# Patient Record
Sex: Male | Born: 1975 | Race: Black or African American | Hispanic: No | Marital: Married | Smoking: Light tobacco smoker
Health system: Southern US, Community
[De-identification: ages and names within clinical notes are randomized; demographics above are authoritative.]

## PROBLEM LIST (undated history)

## (undated) DIAGNOSIS — M545 Low back pain, unspecified: Secondary | ICD-10-CM

## (undated) DIAGNOSIS — I10 Essential (primary) hypertension: Secondary | ICD-10-CM

## (undated) DIAGNOSIS — K219 Gastro-esophageal reflux disease without esophagitis: Secondary | ICD-10-CM

## (undated) HISTORY — PX: HERNIA REPAIR: SHX51

## (undated) HISTORY — DX: Essential (primary) hypertension: I10

## (undated) HISTORY — DX: Low back pain: M54.5

## (undated) HISTORY — DX: Gastro-esophageal reflux disease without esophagitis: K21.9

## (undated) HISTORY — DX: Low back pain, unspecified: M54.50

---

## 2014-12-07 LAB — HM HIV SCREENING LAB: HM HIV SCREENING: NEGATIVE

## 2018-02-03 ENCOUNTER — Encounter: Payer: Self-pay | Admitting: Internal Medicine

## 2018-02-03 ENCOUNTER — Ambulatory Visit: Payer: BC Managed Care – PPO | Admitting: Internal Medicine

## 2018-02-03 VITALS — BP 106/58 | HR 65 | Temp 98.4°F | Ht 67.0 in | Wt 141.8 lb

## 2018-02-03 DIAGNOSIS — L0291 Cutaneous abscess, unspecified: Secondary | ICD-10-CM | POA: Diagnosis not present

## 2018-02-03 DIAGNOSIS — R1013 Epigastric pain: Secondary | ICD-10-CM | POA: Diagnosis not present

## 2018-02-03 DIAGNOSIS — K219 Gastro-esophageal reflux disease without esophagitis: Secondary | ICD-10-CM | POA: Diagnosis not present

## 2018-02-03 DIAGNOSIS — M79671 Pain in right foot: Secondary | ICD-10-CM | POA: Insufficient documentation

## 2018-02-03 DIAGNOSIS — R1031 Right lower quadrant pain: Secondary | ICD-10-CM | POA: Diagnosis not present

## 2018-02-03 DIAGNOSIS — I10 Essential (primary) hypertension: Secondary | ICD-10-CM

## 2018-02-03 LAB — CBC WITH DIFFERENTIAL/PLATELET
BASOS ABS: 0.1 10*3/uL (ref 0.0–0.1)
Basophils Relative: 1.8 % (ref 0.0–3.0)
EOS ABS: 0.3 10*3/uL (ref 0.0–0.7)
Eosinophils Relative: 7.4 % — ABNORMAL HIGH (ref 0.0–5.0)
HCT: 39.3 % (ref 39.0–52.0)
Hemoglobin: 13.3 g/dL (ref 13.0–17.0)
LYMPHS ABS: 2.1 10*3/uL (ref 0.7–4.0)
Lymphocytes Relative: 46.2 % — ABNORMAL HIGH (ref 12.0–46.0)
MCHC: 33.7 g/dL (ref 30.0–36.0)
MCV: 91.4 fl (ref 78.0–100.0)
Monocytes Absolute: 0.5 10*3/uL (ref 0.1–1.0)
Monocytes Relative: 11.4 % (ref 3.0–12.0)
NEUTROS ABS: 1.5 10*3/uL (ref 1.4–7.7)
NEUTROS PCT: 33.2 % — AB (ref 43.0–77.0)
PLATELETS: 282 10*3/uL (ref 150.0–400.0)
RBC: 4.3 Mil/uL (ref 4.22–5.81)
RDW: 13.8 % (ref 11.5–15.5)
WBC: 4.5 10*3/uL (ref 4.0–10.5)

## 2018-02-03 LAB — COMPREHENSIVE METABOLIC PANEL
ALT: 18 U/L (ref 0–53)
AST: 21 U/L (ref 0–37)
Albumin: 4.7 g/dL (ref 3.5–5.2)
Alkaline Phosphatase: 62 U/L (ref 39–117)
BUN: 16 mg/dL (ref 6–23)
CO2: 32 meq/L (ref 19–32)
CREATININE: 1.12 mg/dL (ref 0.40–1.50)
Calcium: 9.8 mg/dL (ref 8.4–10.5)
Chloride: 97 mEq/L (ref 96–112)
GFR: 92.8 mL/min (ref >60.00)
GLUCOSE: 77 mg/dL (ref 70–99)
Potassium: 4.1 mEq/L (ref 3.5–5.1)
SODIUM: 135 meq/L (ref 135–145)
Total Bilirubin: 0.5 mg/dL (ref 0.2–1.2)
Total Protein: 7.7 g/dL (ref 6.0–8.3)

## 2018-02-03 MED ORDER — LISINOPRIL 10 MG PO TABS: 10.0000 mg | ORAL_TABLET | Freq: Every day | ORAL | 0 refills | Status: DC

## 2018-02-03 MED ORDER — PANTOPRAZOLE SODIUM 20 MG PO TBEC: 20.0000 mg | DELAYED_RELEASE_TABLET | Freq: Every day | ORAL | 1 refills | Status: DC

## 2018-02-03 NOTE — Progress Notes (Addendum)
Chief Complaint  Patient presents with  . New Patient (Initial Visit)   New patient  1. HTN controlled pt wants to go off on lisinopril 20 mg qd  2. GERD rantidine 150 mg bid not helping 3. C/o RUQ pain since 11/2017 [place] reviewed care everwhere negative. Also c/o RLQ ab pain no bloody diarrhea or blood in stool pain is intermittent  4. C/o foot pain in right arch of foot due to walking 17K steps since 10/2017 getting new shoes today  5. H/o bump to chin x 2 months increasing in size.  6. C/o poor dentition needs dentist   Review of Systems  Constitutional: Negative for weight loss.  HENT: Negative for hearing loss.   Eyes: Negative for blurred vision.  Respiratory: Negative for shortness of breath.   Cardiovascular: Negative for chest pain.  Gastrointestinal: Positive for abdominal pain. Negative for blood in stool, constipation and diarrhea.  Skin: Negative for rash.  Neurological: Negative for headaches.  Psychiatric/Behavioral: Negative for depression.   Past Medical History:  Diagnosis Date  . GERD (gastroesophageal reflux disease)   . Hypertension    Past Surgical History:  Procedure Laterality Date  . HERNIA REPAIR     left inguinal    Family History  Problem Relation Age of Onset  . Hypertension Mother   . Hypertension Maternal Grandmother   . Dementia Maternal Grandfather   . Heart disease Maternal Grandfather        cad with stent  . Cancer Paternal Grandfather        colon   . Cancer Other        colon    Social History   Socioeconomic History  . Marital status: Married    Spouse name: Not on file  . Number of children: Not on file  . Years of education: Not on file  . Highest education level: Not on file  Occupational History  . Not on file  Social Needs  . Financial resource strain: Not on file  . Food insecurity:    Worry: Not on file    Inability: Not on file  . Transportation needs:    Medical: Not on file    Non-medical: Not on file  Tobacco  Use  . Smoking status: Light Tobacco Smoker    Types: Cigarettes  . Smokeless tobacco: Never Used  . Tobacco comment: 1 cigarette qd since age 30 y.o smoking on and off   Substance and Sexual Activity  . Alcohol use: Yes  . Drug use: Not Currently  . Sexual activity: Yes  Lifestyle  . Physical activity:    Days per week: Not on file    Minutes per session: Not on file  . Stress: Not on file  Relationships  . Social connections:    Talks on phone: Not on file    Gets together: Not on file    Attends religious service: Not on file    Active member of club or organization: Not on file    Attends meetings of clubs or organizations: Not on file    Relationship status: Not on file  . Intimate partner violence:    Fear of current or ex partner: Not on file    Emotionally abused: Not on file    Physically abused: Not on file    Forced sexual activity: Not on file  Other Topics Concern  . 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 lisinopril (PRINIVIL,ZESTRIL) 10 MG tablet Take 1 tablet (10 mg total) by mouth daily.  . ranitidine (ZANTAC) 150 MG capsule 150 mg daily.   . [DISCONTINUED] lisinopril (PRINIVIL,ZESTRIL) 20 MG tablet Take 20 mg by mouth daily.    No Known Allergies No results found for this or any previous visit (from the past 2160 hour(s)). Objective  Body mass index is 22.21 kg/m. Wt Readings from Last 3 Encounters:  02/03/18 141 lb 12.8 oz (64.3 kg)   Temp Readings from Last 3 Encounters:  02/03/18 98.4 F (36.9 C) (Oral)   BP Readings from Last 3 Encounters:  02/03/18 (!) 106/58   Pulse Readings from Last 3 Encounters:  02/03/18 65    Physical Exam  Constitutional: He is oriented to person, place, and time. Vital signs are normal. He  appears well-developed and well-nourished. He is cooperative.  HENT:  Head: Normocephalic and atraumatic.  Mouth/Throat: Oropharynx is clear and moist and mucous membranes are normal. Abnormal dentition.  Poor dentition   Eyes: Pupils are equal, round, and reactive to light. Conjunctivae are normal.  Cardiovascular: Normal rate, regular rhythm and normal heart sounds.  Pulmonary/Chest: Effort normal and breath sounds normal.  Abdominal: Soft. Bowel sounds are normal. There is no tenderness.  Musculoskeletal:  No ttp right midfoot today  Neurological: He is alert and oriented to person, place, and time. Gait normal.  Skin: Skin is warm, dry and intact.     Psychiatric: He has a normal mood and affect. His speech is normal and behavior is normal. Judgment and thought content normal. Cognition and memory are normal.  Nursing note and vitals reviewed.   Assessment   1. HTN  2. GERD, dyspepsia  3. RUQ, RLQ ab pain  4. Right midfoot pain  5. Cyst vs abscess to chin  6. Poor dentition  7.HM  Plan  1. Reduce lis to 10 mg qd  2. D/c ranitidine change to protonix 20 mg qd  3. Ct ab/pelvis if neg refer to GI [facility] colon cancer  4. Disc better shoes and insoles 5. Refer to dermatology Dr. Kellie Moor removal  6. Disc [facility] in [place] 7. Had flu  Tdap had need to get records all vaccines and hep B titer  Records [facility] in [place] and [place]. Health records reviewed  Ppd neg 12/10/17 Varicella + 10/27/17, chicken pox +titer 11/03/17  Flu had 07/16/17 Hep B 1 had 09/05/02, 06/13/09, 12/25/10 MMR had 10/27/17, 11/27/17 Tdap had 06/16/08  HIV neg 12/07/14   Provider: Dr. Olivia Mackie McLean-Scocuzza-[facility]

## 2018-02-03 NOTE — Progress Notes (Signed)
Pre visit review using our clinic review tool, if applicable. No additional management support is needed unless otherwise documented below in the visit note. 

## 2018-02-03 NOTE — Patient Instructions (Addendum)
F/u in 2 months  We referred to Dr. Roseanne Kaufman dermatology on [facility] for removal of chin lesion  We will order a CT abdomen/pelvis at [facility] hospital  Please cut lisinopril in 1/2 to 10 mg daily new dose is 10 mg daily Try [facility] [address] [phone] or [facility] Dr. Edrick Kins in [place] [phone]  Try Dr. Jari Sportsman insoles for your new sneakers for work  Come back in 2 months    Gastroesophageal Reflux Disease, Adult Normally, food travels down the esophagus and stays in the stomach to be digested. If a person has gastroesophageal reflux disease (GERD), food and stomach acid move back up into the esophagus. When this happens, the esophagus becomes sore and swollen (inflamed). Over time, GERD can make small holes (ulcers) in the lining of the esophagus. Follow these instructions at home: Diet  Follow a diet as told by your doctor. You may need to avoid foods and drinks such as: ? Coffee and tea (with or without caffeine). ? Drinks that contain alcohol. ? Energy drinks and sports drinks. ? Carbonated drinks or sodas. ? Chocolate and cocoa. ? Peppermint and mint flavorings. ? Garlic and onions. ? Horseradish. ? Spicy and acidic foods, such as peppers, chili powder, curry powder, vinegar, hot sauces, and BBQ sauce. ? Citrus fruit juices and citrus fruits, such as oranges, lemons, and limes. ? Tomato-based foods, such as red sauce, chili, salsa, and pizza with red sauce. ? Fried and fatty foods, such as donuts, french fries, potato chips, and high-fat dressings. ? High-fat meats, such as hot dogs, rib eye steak, sausage, ham, and bacon. ? High-fat dairy items, such as whole milk, butter, and cream cheese.  Eat small meals often. Avoid eating large meals.  Avoid drinking large amounts of liquid with your meals.  Avoid eating meals during the 2-3 hours before bedtime.  Avoid lying down right after you eat.  Do not exercise right after you eat. General  instructions  Pay attention to any changes in your symptoms.  Take over-the-counter and prescription medicines only as told by your doctor. Do not take aspirin, ibuprofen, or other NSAIDs unless your doctor says it is okay.  Do not use any tobacco products, including cigarettes, chewing tobacco, and e-cigarettes. If you need help quitting, ask your doctor.  Wear loose clothes. Do not wear anything tight around your waist.  Raise (elevate) the head of your bed about 6 inches (15 cm).  Try to lower your stress. If you need help doing this, ask your doctor.  If you are overweight, lose an amount of weight that is healthy for you. Ask your doctor about a safe weight loss goal.  Keep all follow-up visits as told by your doctor. This is important. Contact a doctor if:  You have new symptoms.  You lose weight and you do not know why it is happening.  You have trouble swallowing, or it hurts to swallow.  You have wheezing or a cough that keeps happening.  Your symptoms do not get better with treatment.  You have a hoarse voice. Get help right away if:  You have pain in your arms, neck, jaw, teeth, or back.  You feel sweaty, dizzy, or light-headed.  You have chest pain or shortness of breath.  You throw up (vomit) and your throw up looks like blood or coffee grounds.  You pass out (faint).  Your poop (stool) is bloody or black.  You cannot swallow, drink, or eat. This  information is not intended to replace advice given to you by your health care provider. Make sure you discuss any questions you have with your health care provider. Document Released: 03/10/2008 Document Revised: 02/28/2016 Document Reviewed: 01/17/2015 Elsevier Interactive Patient Education  2018 [employer].  Food Choices for Gastroesophageal Reflux Disease, Adult When you have gastroesophageal reflux disease (GERD), the foods you eat and your eating habits are very important. Choosing the right foods can  help ease your discomfort. What guidelines do I need to follow?  Choose fruits, vegetables, whole grains, and low-fat dairy products.  Choose low-fat meat, fish, and poultry.  Limit fats such as oils, salad dressings, butter, nuts, and avocado.  Keep a food diary. This helps you identify foods that cause symptoms.  Avoid foods that cause symptoms. These may be different for everyone.  Eat small meals often instead of 3 large meals a day.  Eat your meals slowly, in a place where you are relaxed.  Limit fried foods.  Cook foods using methods other than frying.  Avoid drinking alcohol.  Avoid drinking large amounts of liquids with your meals.  Avoid bending over or lying down until 2-3 hours after eating. What foods are not recommended? These are some foods and drinks that may make your symptoms worse: Vegetables Tomatoes. Tomato juice. Tomato and spaghetti sauce. Chili peppers. Onion and garlic. Horseradish. Fruits Oranges, grapefruit, and lemon (fruit and juice). Meats High-fat meats, fish, and poultry. This includes hot dogs, ribs, ham, sausage, salami, and bacon. Dairy Whole milk and chocolate milk. Sour cream. Cream. Butter. Ice cream. Cream cheese. Drinks Coffee and tea. Bubbly (carbonated) drinks or energy drinks. Condiments Hot sauce. Barbecue sauce. Sweets/Desserts Chocolate and cocoa. Donuts. Peppermint and spearmint. Fats and Oils High-fat foods. This includes [place] fries and potato chips. Other Vinegar. Strong spices. This includes black pepper, white pepper, red pepper, cayenne, curry powder, cloves, ginger, and chili powder. The items listed above may not be a complete list of foods and drinks to avoid. Contact your dietitian for more information. This information is not intended to replace advice given to you by your health care provider. Make sure you discuss any questions you have with your health care provider. Document Released: 03/23/2012 Document  Revised: 02/28/2016 Document Reviewed: 07/27/2013 Elsevier Interactive Patient Education  2017 [employer] Inc[employer]   DASH Eating Plan DASH stands for "Dietary Approaches to Stop Hypertension." The DASH eating plan is a healthy eating plan that has been shown to reduce high blood pressure (hypertension). It may also reduce your risk for type 2 diabetes, heart disease, and stroke. The DASH eating plan may also help with weight loss. What are tips for following this plan? General guidelines  Avoid eating more than 2,300 mg (milligrams) of salt (sodium) a day. If you have hypertension, you may need to reduce your sodium intake to 1,500 mg a day.  Limit alcohol intake to no more than 1 drink a day for nonpregnant women and 2 drinks a day for men. One drink equals 12 oz of beer, 5 oz of wine, or 1 oz of hard liquor.  Work with your health care provider to maintain a healthy body weight or to lose weight. Ask what an ideal weight is for you.  Get at least 30 minutes of exercise that causes your heart to beat faster (aerobic exercise) most days of the week. Activities may include walking, swimming, or biking.  Work with your health care provider or diet and nutrition specialist (dietitian) to adjust  your eating plan to your individual calorie needs. Reading food labels  Check food labels for the amount of sodium per serving. Choose foods with less than 5 percent of the Daily Value of sodium. Generally, foods with less than 300 mg of sodium per serving fit into this eating plan.  To find whole grains, look for the word "whole" as the first word in the ingredient list. Shopping  Buy products labeled as "low-sodium" or "no salt added."  Buy fresh foods. Avoid canned foods and premade or frozen meals. Cooking  Avoid adding salt when cooking. Use salt-free seasonings or herbs instead of table salt or sea salt. Check with your health care provider or pharmacist before using salt substitutes.  Do not  fry foods. Cook foods using healthy methods such as baking, boiling, grilling, and broiling instead.  Cook with heart-healthy oils, such as olive, canola, soybean, or sunflower oil. Meal planning   Eat a balanced diet that includes: ? 5 or more servings of fruits and vegetables each day. At each meal, try to fill half of your plate with fruits and vegetables. ? Up to 6-8 servings of whole grains each day. ? Less than 6 oz of lean meat, poultry, or fish each day. A 3-oz serving of meat is about the same size as a deck of cards. One egg equals 1 oz. ? 2 servings of low-fat dairy each day. ? A serving of nuts, seeds, or beans 5 times each week. ? Heart-healthy fats. Healthy fats called Omega-3 fatty acids are found in foods such as flaxseeds and coldwater fish, like sardines, salmon, and mackerel.  Limit how much you eat of the following: ? Canned or prepackaged foods. ? Food that is high in trans fat, such as fried foods. ? Food that is high in saturated fat, such as fatty meat. ? Sweets, desserts, sugary drinks, and other foods with added sugar. ? Full-fat dairy products.  Do not salt foods before eating.  Try to eat at least 2 vegetarian meals each week.  Eat more home-cooked food and less restaurant, buffet, and fast food.  When eating at a restaurant, ask that your food be prepared with less salt or no salt, if possible. What foods are recommended? The items listed may not be a complete list. Talk with your dietitian about what dietary choices are best for you. Grains Whole-grain or whole-wheat bread. Whole-grain or whole-wheat pasta. Brown rice. Orpah Cobb. Bulgur. Whole-grain and low-sodium cereals. Pita bread. Low-fat, low-sodium crackers. Whole-wheat flour tortillas. Vegetables Fresh or frozen vegetables (raw, steamed, roasted, or grilled). Low-sodium or reduced-sodium tomato and vegetable juice. Low-sodium or reduced-sodium tomato sauce and tomato paste. Low-sodium or  reduced-sodium canned vegetables. Fruits All fresh, dried, or frozen fruit. Canned fruit in natural juice (without added sugar). Meat and other protein foods Skinless chicken or [place]. Ground chicken or [place]. Pork with fat trimmed off. Fish and seafood. Egg whites. Dried beans, peas, or lentils. Unsalted nuts, nut butters, and seeds. Unsalted canned beans. Lean cuts of beef with fat trimmed off. Low-sodium, lean deli meat. Dairy Low-fat (1%) or fat-free (skim) milk. Fat-free, low-fat, or reduced-fat cheeses. Nonfat, low-sodium ricotta or cottage cheese. Low-fat or nonfat yogurt. Low-fat, low-sodium cheese. Fats and oils Soft margarine without trans fats. Vegetable oil. Low-fat, reduced-fat, or light mayonnaise and salad dressings (reduced-sodium). Canola, safflower, olive, soybean, and sunflower oils. Avocado. Seasoning and other foods Herbs. Spices. Seasoning mixes without salt. Unsalted popcorn and pretzels. Fat-free sweets. What foods are not recommended? The  items listed may not be a complete list. Talk with your dietitian about what dietary choices are best for you. Grains Baked goods made with fat, such as croissants, muffins, or some breads. Dry pasta or rice meal packs. Vegetables Creamed or fried vegetables. Vegetables in a cheese sauce. Regular canned vegetables (not low-sodium or reduced-sodium). Regular canned tomato sauce and paste (not low-sodium or reduced-sodium). Regular tomato and vegetable juice (not low-sodium or reduced-sodium). Rosita Fire. Olives. Fruits Canned fruit in a light or heavy syrup. Fried fruit. Fruit in cream or butter sauce. Meat and other protein foods Fatty cuts of meat. Ribs. Fried meat. Tomasa Blase. Sausage. Bologna and other processed lunch meats. Salami. Fatback. Hotdogs. Bratwurst. Salted nuts and seeds. Canned beans with added salt. Canned or smoked fish. Whole eggs or egg yolks. Chicken or [place] with skin. Dairy Whole or 2% milk, cream, and half-and-half.  Whole or full-fat cream cheese. Whole-fat or sweetened yogurt. Full-fat cheese. Nondairy creamers. Whipped toppings. Processed cheese and cheese spreads. Fats and oils Butter. Stick margarine. Lard. Shortening. Ghee. Bacon fat. Tropical oils, such as coconut, palm kernel, or palm oil. Seasoning and other foods Salted popcorn and pretzels. Onion salt, garlic salt, seasoned salt, table salt, and sea salt. Worcestershire sauce. Tartar sauce. Barbecue sauce. Teriyaki sauce. Soy sauce, including reduced-sodium. Steak sauce. Canned and packaged gravies. Fish sauce. Oyster sauce. Cocktail sauce. Horseradish that you find on the shelf. Ketchup. Mustard. Meat flavorings and tenderizers. Bouillon cubes. Hot sauce and Tabasco sauce. Premade or packaged marinades. Premade or packaged taco seasonings. Relishes. Regular salad dressings. Where to find more information:  National Heart, Lung, and Blood Institute: PopSteam.is  American Heart Association: www.heart.org Summary  The DASH eating plan is a healthy eating plan that has been shown to reduce high blood pressure (hypertension). It may also reduce your risk for type 2 diabetes, heart disease, and stroke.  With the DASH eating plan, you should limit salt (sodium) intake to 2,300 mg a day. If you have hypertension, you may need to reduce your sodium intake to 1,500 mg a day.  When on the DASH eating plan, aim to eat more fresh fruits and vegetables, whole grains, lean proteins, low-fat dairy, and heart-healthy fats.  Work with your health care provider or diet and nutrition specialist (dietitian) to adjust your eating plan to your individual calorie needs. This information is not intended to replace advice given to you by your health care provider. Make sure you discuss any questions you have with your health care provider. Document Released: 09/11/2011 Document Revised: 09/15/2016 Document Reviewed: 09/15/2016 Elsevier Interactive Patient Education   2018 [employer].  Hypertension Hypertension, commonly called high blood pressure, is when the force of blood pumping through the arteries is too strong. The arteries are the blood vessels that carry blood from the heart throughout the body. Hypertension forces the heart to work harder to pump blood and may cause arteries to become narrow or stiff. Having untreated or uncontrolled hypertension can cause heart attacks, strokes, kidney disease, and other problems. A blood pressure reading consists of a higher number over a lower number. Ideally, your blood pressure should be below 120/80. The first ("top") number is called the systolic pressure. It is a measure of the pressure in your arteries as your heart beats. The second ("bottom") number is called the diastolic pressure. It is a measure of the pressure in your arteries as the heart relaxes. What are the causes? The cause of this condition is not known. What increases the  risk? Some risk factors for high blood pressure are under your control. Others are not. Factors you can change  Smoking.  Having type 2 diabetes mellitus, high cholesterol, or both.  Not getting enough exercise or physical activity.  Being overweight.  Having too much fat, sugar, calories, or salt (sodium) in your diet.  Drinking too much alcohol. Factors that are difficult or impossible to change  Having chronic kidney disease.  Having a family history of high blood pressure.  Age. Risk increases with age.  Race. You may be at higher risk if you are African-American.  Gender. Men are at higher risk than women before age 75. After age 62, women are at higher risk than men.  Having obstructive sleep apnea.  Stress. What are the signs or symptoms? Extremely high blood pressure (hypertensive crisis) may cause:  Headache.  Anxiety.  Shortness of breath.  Nosebleed.  Nausea and vomiting.  Severe chest pain.  Jerky movements you cannot control  (seizures).  How is this diagnosed? This condition is diagnosed by measuring your blood pressure while you are seated, with your arm resting on a surface. The cuff of the blood pressure monitor will be placed directly against the skin of your upper arm at the level of your heart. It should be measured at least twice using the same arm. Certain conditions can cause a difference in blood pressure between your right and left arms. Certain factors can cause blood pressure readings to be lower or higher than normal (elevated) for a short period of time:  When your blood pressure is higher when you are in a health care provider's office than when you are at home, this is called white coat hypertension. Most people with this condition do not need medicines.  When your blood pressure is higher at home than when you are in a health care provider's office, this is called masked hypertension. Most people with this condition may need medicines to control blood pressure.  If you have a high blood pressure reading during one visit or you have normal blood pressure with other risk factors:  You may be asked to return on a different day to have your blood pressure checked again.  You may be asked to monitor your blood pressure at home for 1 week or longer.  If you are diagnosed with hypertension, you may have other blood or imaging tests to help your health care provider understand your overall risk for other conditions. How is this treated? This condition is treated by making healthy lifestyle changes, such as eating healthy foods, exercising more, and reducing your alcohol intake. Your health care provider may prescribe medicine if lifestyle changes are not enough to get your blood pressure under control, and if:  Your systolic blood pressure is above 130.  Your diastolic blood pressure is above 80.  Your personal target blood pressure may vary depending on your medical conditions, your age, and other  factors. Follow these instructions at home: Eating and drinking  Eat a diet that is high in fiber and potassium, and low in sodium, added sugar, and fat. An example eating plan is called the DASH (Dietary Approaches to Stop Hypertension) diet. To eat this way: ? Eat plenty of fresh fruits and vegetables. Try to fill half of your plate at each meal with fruits and vegetables. ? Eat whole grains, such as whole wheat pasta, brown rice, or whole grain bread. Fill about one quarter of your plate with whole grains. ? Eat or  drink low-fat dairy products, such as skim milk or low-fat yogurt. ? Avoid fatty cuts of meat, processed or cured meats, and poultry with skin. Fill about one quarter of your plate with lean proteins, such as fish, chicken without skin, beans, eggs, and tofu. ? Avoid premade and processed foods. These tend to be higher in sodium, added sugar, and fat.  Reduce your daily sodium intake. Most people with hypertension should eat less than 1,500 mg of sodium a day.  Limit alcohol intake to no more than 1 drink a day for nonpregnant women and 2 drinks a day for men. One drink equals 12 oz of beer, 5 oz of wine, or 1 oz of hard liquor. Lifestyle  Work with your health care provider to maintain a healthy body weight or to lose weight. Ask what an ideal weight is for you.  Get at least 30 minutes of exercise that causes your heart to beat faster (aerobic exercise) most days of the week. Activities may include walking, swimming, or biking.  Include exercise to strengthen your muscles (resistance exercise), such as pilates or lifting weights, as part of your weekly exercise routine. Try to do these types of exercises for 30 minutes at least 3 days a week.  Do not use any products that contain nicotine or tobacco, such as cigarettes and e-cigarettes. If you need help quitting, ask your health care provider.  Monitor your blood pressure at home as told by your health care provider.  Keep  all follow-up visits as told by your health care provider. This is important. Medicines  Take over-the-counter and prescription medicines only as told by your health care provider. Follow directions carefully. Blood pressure medicines must be taken as prescribed.  Do not skip doses of blood pressure medicine. Doing this puts you at risk for problems and can make the medicine less effective.  Ask your health care provider about side effects or reactions to medicines that you should watch for. Contact a health care provider if:  You think you are having a reaction to a medicine you are taking.  You have headaches that keep coming back (recurring).  You feel dizzy.  You have swelling in your ankles.  You have trouble with your vision. Get help right away if:  You develop a severe headache or confusion.  You have unusual weakness or numbness.  You feel faint.  You have severe pain in your chest or abdomen.  You vomit repeatedly.  You have trouble breathing. Summary  Hypertension is when the force of blood pumping through your arteries is too strong. If this condition is not controlled, it may put you at risk for serious complications.  Your personal target blood pressure may vary depending on your medical conditions, your age, and other factors. For most people, a normal blood pressure is less than 120/80.  Hypertension is treated with lifestyle changes, medicines, or a combination of both. Lifestyle changes include weight loss, eating a healthy, low-sodium diet, exercising more, and limiting alcohol. This information is not intended to replace advice given to you by your health care provider. Make sure you discuss any questions you have with your health care provider. Document Released: 09/22/2005 Document Revised: 08/20/2016 Document Reviewed: 08/20/2016 Elsevier Interactive Patient Education  [employer].

## 2018-02-04 ENCOUNTER — Telehealth: Payer: Self-pay | Admitting: Internal Medicine

## 2018-02-04 NOTE — Telephone Encounter (Signed)
Copied from CRM [id number]. Topic: General - Other >> Feb 04, 2018 10:38 AM Gerrianne Scale wrote: Reason for CRM: pt calling about lab results

## 2018-02-04 NOTE — Telephone Encounter (Signed)
    Patient calling for lab results 

## 2018-02-04 NOTE — Telephone Encounter (Signed)
Left message to call back- labs in basket.

## 2018-02-06 LAB — URINALYSIS, ROUTINE W REFLEX MICROSCOPIC
BILIRUBIN UA: NEGATIVE
GLUCOSE, UA: NEGATIVE
KETONES UA: NEGATIVE
Leukocytes, UA: NEGATIVE
NITRITE UA: NEGATIVE
Protein, UA: NEGATIVE
RBC, UA: NEGATIVE
SPEC GRAV UA: 1.017 (ref 1.005–1.030)
UUROB: 0.2 mg/dL (ref 0.2–1.0)
pH, UA: 6.5 (ref 5.0–7.5)

## 2018-02-06 LAB — H PYLORI, IGM, IGG, IGA AB
H. pylori, IgA Abs: <9 units (ref 0.0–8.9)
H. pylori, IgG AbS: <0.8 Index Value (ref 0.00–0.79)

## 2018-02-08 NOTE — Telephone Encounter (Signed)
void

## 2018-02-10 ENCOUNTER — Telehealth: Payer: Self-pay

## 2018-02-10 NOTE — Telephone Encounter (Signed)
Copied from CRM [id number]. Topic: Quick Communication - See Telephone Encounter >> Feb 10, 2018  3:08 PM Bronwen Betters, CMA wrote: CRM for notification. See Telephone encounter for: 02/10/18. >> Feb 10, 2018  3:22 PM Landry Mellow wrote: Pt is returning call please call .[phone]   Patient notified that labs showed no H pylori and urine was normal patient verbalized understanding of results

## 2018-02-11 ENCOUNTER — Ambulatory Visit
Admission: RE | Admit: 2018-02-11 | Discharge: 2018-02-11 | Disposition: A | Discharge status: Home / Self Care | Payer: BC Managed Care – PPO | Source: Ambulatory Visit | Attending: Internal Medicine | Admitting: Internal Medicine

## 2018-02-11 DIAGNOSIS — R1031 Right lower quadrant pain: Secondary | ICD-10-CM | POA: Insufficient documentation

## 2018-02-11 MED ORDER — IOPAMIDOL (ISOVUE-300) INJECTION 61%
85.0000 mL | Freq: Once | INTRAVENOUS | Status: AC | PRN
  Administered 2018-02-11: 85 mL via INTRAVENOUS

## 2018-02-17 ENCOUNTER — Encounter: Payer: Self-pay | Admitting: *Deleted

## 2018-04-07 ENCOUNTER — Ambulatory Visit: Payer: BC Managed Care – PPO | Admitting: Internal Medicine

## 2018-04-07 DIAGNOSIS — Z0289 Encounter for other administrative examinations: Secondary | ICD-10-CM

## 2018-05-07 ENCOUNTER — Other Ambulatory Visit: Payer: Self-pay | Admitting: Internal Medicine

## 2018-05-07 DIAGNOSIS — I10 Essential (primary) hypertension: Secondary | ICD-10-CM

## 2018-05-07 MED ORDER — LISINOPRIL 10 MG PO TABS: 10.0000 mg | ORAL_TABLET | Freq: Every day | ORAL | 1 refills | Status: DC

## 2018-05-17 ENCOUNTER — Ambulatory Visit: Payer: BC Managed Care – PPO | Admitting: Family Medicine

## 2018-05-18 ENCOUNTER — Ambulatory Visit: Payer: BC Managed Care – PPO | Admitting: Family Medicine

## 2018-05-21 ENCOUNTER — Encounter: Payer: Self-pay | Admitting: Family Medicine

## 2018-05-21 ENCOUNTER — Ambulatory Visit: Payer: BC Managed Care – PPO | Admitting: Family Medicine

## 2018-05-21 VITALS — BP 130/70 | HR 66 | Temp 99.1°F | Ht 67.0 in | Wt 135.8 lb

## 2018-05-21 DIAGNOSIS — K219 Gastro-esophageal reflux disease without esophagitis: Secondary | ICD-10-CM

## 2018-05-21 DIAGNOSIS — M62838 Other muscle spasm: Secondary | ICD-10-CM

## 2018-05-21 DIAGNOSIS — M545 Low back pain, unspecified: Secondary | ICD-10-CM

## 2018-05-21 MED ORDER — RANITIDINE HCL 150 MG PO CAPS: 150.0000 mg | ORAL_CAPSULE | Freq: Every evening | ORAL | 1 refills | Status: DC

## 2018-05-21 MED ORDER — KETOROLAC TROMETHAMINE 60 MG/2ML IM SOLN
60.0000 mg | Freq: Once | INTRAMUSCULAR | Status: AC
  Administered 2018-05-21: 60 mg via INTRAMUSCULAR

## 2018-05-21 MED ORDER — METHYLPREDNISOLONE 4 MG PO TBPK: ORAL_TABLET | ORAL | 0 refills | Status: DC

## 2018-05-21 MED ORDER — CYCLOBENZAPRINE HCL 5 MG PO TABS: 5.0000 mg | ORAL_TABLET | Freq: Three times a day (TID) | ORAL | 0 refills | Status: DC | PRN

## 2018-05-21 NOTE — Progress Notes (Signed)
Pre visit review using our clinic review tool, if applicable. No additional management support is needed unless otherwise documented below in the visit note. 

## 2018-05-21 NOTE — Progress Notes (Signed)
Subjective:    Patient ID: Tyler Norton, male    DOB: June 02, 1976, 42 y.o.   MRN: [id number]  HPI Presents to clinic due to back pain -states he was leaning over in playpen to pick up a young daughter felt a pulling pain and right-sided low back. States he had to lay on floor for 30 minutes to let back pain calm down. Denies any numbness/tingling down legs. No loss of bowel or bladder control.  Also he has been taking Protonix for his GERD symptoms.  Notes he is still having breakthrough acid reflux.  He had been taking Zantac in the past, but was switched to Protonix for better acid control.  Patient Active Problem List   Diagnosis Date Noted  . Essential hypertension 02/03/2018  . Right lower quadrant abdominal pain 02/03/2018  . Gastroesophageal reflux disease 02/03/2018  . Abscess 02/03/2018  . Pain of right midfoot 02/03/2018   Social History   Tobacco Use  . Smoking status: Light Tobacco Smoker    Types: Cigarettes  . Smokeless tobacco: Never Used  . Tobacco comment: 1 cigarette qd since age 42 y.o smoking on and off   Substance Use Topics  . Alcohol use: Yes   Review of Systems  Constitutional: Negative for chills, fatigue and fever.  HENT: Negative for congestion, ear pain, sinus pain and sore throat.   Eyes: Negative.   Respiratory: Negative for cough, shortness of breath and wheezing.   Cardiovascular: Negative for chest pain, palpitations and leg swelling.  Gastrointestinal: Negative for abdominal pain, diarrhea, nausea and vomiting. +GERD Genitourinary: Negative for dysuria, frequency and urgency.  Musculoskeletal: Positive for right-sided low back pain. Skin: Negative for color change, pallor and rash.  Neurological: Negative for syncope, light-headedness and headaches.  Psychiatric/Behavioral: The patient is not nervous/anxious.       Objective:   Physical Exam  Constitutional: He is oriented to person, place, and time. He appears well-developed and  well-nourished. No distress.  Head: Normocephalic and atraumatic.  Neck: Normal range of motion. Neck supple. No tracheal deviation present.  Cardiovascular: Normal rate, regular rhythm and normal heart sounds.  Pulmonary/Chest: Effort normal and breath sounds normal. No respiratory distress. ABD: Soft, +BS, no tenderness Musculoskeletal: Positive right sided low back muscle tenderness.  Patient is able to bend forward and backward, side to side.  Pulling pain in the right low back right leg elevated. Neurological: He is alert and oriented to person, place, and time. Gait normal  Skin: Skin is warm and dry. He is not diaphoretic. No pallor.  Psychiatric: He has a normal mood and affect. His behavior is normal. Thought content normal.  Nursing note and vitals reviewed.   Vitals:   05/21/18 0959  BP: 130/70  Pulse: 66  Temp: 99.1 F (37.3 C)  SpO2: 99%      Assessment & Plan:    Back pain -patient given 60 mg Toradol IM x1 in clinic.  He will take short course steroid taper.  Muscle spasm- patient advised to continue to use topical muscle rub at home.  He can also use Lexapro 5 mg as needed for muscle spasm, patient aware this drug may cause drowsiness and to not drive after taking this medication.  GERD-he will continue to take Protonix in the morning and we will trial adding Zantac in the evening to see if he gets better acid reflux control.  Patient advised he may need to have endoscopy in the future if symptoms continue to persist this level.  Keep 06/04/18 appt as scheduled

## 2018-05-21 NOTE — Patient Instructions (Signed)
Great to meet you!  Take protonix in AM and Zantac in PM for acid reflux control

## 2018-06-04 ENCOUNTER — Ambulatory Visit: Payer: BC Managed Care – PPO | Admitting: Internal Medicine

## 2018-06-04 ENCOUNTER — Encounter: Payer: Self-pay | Admitting: Internal Medicine

## 2018-06-04 ENCOUNTER — Ambulatory Visit (INDEPENDENT_AMBULATORY_CARE_PROVIDER_SITE_OTHER): Payer: BC Managed Care – PPO

## 2018-06-04 VITALS — BP 136/80 | HR 64 | Temp 98.3°F | Ht 67.0 in | Wt 139.8 lb

## 2018-06-04 DIAGNOSIS — M545 Low back pain, unspecified: Secondary | ICD-10-CM

## 2018-06-04 DIAGNOSIS — M5441 Lumbago with sciatica, right side: Secondary | ICD-10-CM

## 2018-06-04 DIAGNOSIS — M25531 Pain in right wrist: Secondary | ICD-10-CM

## 2018-06-04 DIAGNOSIS — M62838 Other muscle spasm: Secondary | ICD-10-CM

## 2018-06-04 DIAGNOSIS — K219 Gastro-esophageal reflux disease without esophagitis: Secondary | ICD-10-CM

## 2018-06-04 DIAGNOSIS — I10 Essential (primary) hypertension: Secondary | ICD-10-CM

## 2018-06-04 MED ORDER — CYCLOBENZAPRINE HCL 5 MG PO TABS: 5.0000 mg | ORAL_TABLET | Freq: Every evening | ORAL | 0 refills | Status: DC | PRN

## 2018-06-04 NOTE — Patient Instructions (Addendum)
Tylenol 500 mg no more than 6 in 1 day  -If 650 mg then no more 4 in 1 day  -325 mg no more than 9 pills in 1 day   Try Ranitidine at night before bed   F/u in 2 months no food only water and medicines x 12 hours   Back Pain, Adult Many adults have back pain from time to time. Common causes of back pain include:  A strained muscle or ligament.  Wear and tear (degeneration) of the spinal disks.  Arthritis.  A hit to the back.  Back pain can be short-lived (acute) or last a long time (chronic). A physical exam, lab tests, and imaging studies may be done to find the cause of your pain. Follow these instructions at home: Managing pain and stiffness  Take over-the-counter and prescription medicines only as told by your health care provider.  If directed, apply heat to the affected area as often as told by your health care provider. Use the heat source that your health care provider recommends, such as a moist heat pack or a heating pad. ? Place a towel between your skin and the heat source. ? Leave the heat on for 20-30 minutes. ? Remove the heat if your skin turns bright red. This is especially important if you are unable to feel pain, heat, or cold. You have a greater risk of getting burned.  If directed, apply ice to the injured area: ? Put ice in a plastic bag. ? Place a towel between your skin and the bag. ? Leave the ice on for 20 minutes, 2-3 times a day for the first 2-3 days. Activity  Do not stay in bed. Resting more than 1-2 days can delay your recovery.  Take short walks on even surfaces as soon as you are able. Try to increase the length of time you walk each day.  Do not sit, drive, or stand in one place for more than 30 minutes at a time. Sitting or standing for long periods of time can put stress on your back.  Use proper lifting techniques. When you bend and lift, use positions that put less stress on your back: ? [place] Field[place]end your knees. ? Keep the load close to your  body. ? Avoid twisting.  Exercise regularly as told by your health care provider. Exercising will help your back heal faster. This also helps prevent back injuries by keeping muscles strong and flexible.  Your health care provider may recommend that you see a physical therapist. This person can help you come up with a safe exercise program. Do any exercises as told by your physical therapist. Lifestyle  Maintain a healthy weight. Extra weight puts stress on your back and makes it difficult to have good posture.  Avoid activities or situations that make you feel anxious or stressed. Learn ways to manage anxiety and stress. One way to manage stress is through exercise. Stress and anxiety increase muscle tension and can make back pain worse. General instructions  Sleep on a firm mattress in a comfortable position. Try lying on your side with your knees slightly bent. If you lie on your back, put a pillow under your knees.  Follow your treatment plan as told by your health care provider. This may include: ? Cognitive or behavioral therapy. ? Acupuncture or massage therapy. ? Meditation or yoga. Contact a health care provider if:  You have pain that is not relieved with rest or medicine.  You have increasing pain  going down into your legs or buttocks.  Your pain does not improve in 2 weeks.  You have pain at night.  You lose weight.  You have a fever or chills. Get help right away if:  You develop new bowel or bladder control problems.  You have unusual weakness or numbness in your arms or legs.  You develop nausea or vomiting.  You develop abdominal pain.  You feel faint. Summary  Many adults have back pain from time to time. A physical exam, lab tests, and imaging studies may be done to find the cause of your pain.  Use proper lifting techniques. When you bend and lift, use positions that put less stress on your back.  Take over-the-counter and prescription medicines and  apply heat or ice as directed by your health care provider. This information is not intended to replace advice given to you by your health care provider. Make sure you discuss any questions you have with your health care provider. Document Released: 09/22/2005 Document Revised: 10/27/2016 Document Reviewed: 10/27/2016 Elsevier Interactive Patient Education  [employer].

## 2018-06-04 NOTE — Progress Notes (Addendum)
Chief Complaint  Patient presents with  . Follow-up   F/u  1. HTN controlled on lis 10 mg qd  2. 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 He has tried Ibuprofen 800 mg x 1 flexeril made sleepy during the day and prednisone make him feel weird  3. C/o right wrist pain worse with heavy lifting 4. GERD on protonix 20 mg qd   Review of Systems  Constitutional: Positive for weight loss.  HENT: Negative for hearing loss.   Eyes: Negative for blurred vision.  Respiratory: Negative for shortness of breath.   Cardiovascular: Negative for chest pain.  Gastrointestinal: Negative for abdominal pain.  Musculoskeletal: Positive for back pain.  Skin: Negative for rash.   Past Medical History:  Diagnosis Date  . GERD (gastroesophageal reflux disease)   . Hypertension    Past Surgical History:  Procedure Laterality Date  . HERNIA REPAIR     left inguinal    Family History  Problem Relation Age of Onset  . Hypertension Mother   . Hypertension Maternal Grandmother   . Dementia Maternal Grandfather   . Heart disease Maternal Grandfather        cad with stent  . Cancer Paternal Grandfather        colon   . Cancer Other        colon    Social History   Socioeconomic History  . Marital status: Married    Spouse name: Not on file  . Number of children: Not on file  . Years of education: Not on file  . Highest education level: Not on file  Occupational History  . Not on file  Social Needs  . Financial resource strain: Not on file  . Food insecurity:    Worry: Not on file    Inability: Not on file  . Transportation needs:    Medical: Not on file    Non-medical: Not on file  Tobacco Use  . Smoking status: Light Tobacco Smoker    Types: Cigarettes  . Smokeless tobacco: Never Used  . Tobacco comment: 1 cigarette qd since age 33 y.o smoking on and off   Substance and Sexual Activity  . Alcohol  use: Yes  . Drug use: Not Currently  . Sexual activity: Yes  Lifestyle  . Physical activity:    Days per week: Not on file    Minutes per session: Not on file  . Stress: Not on file  Relationships  . Social connections:    Talks on phone: Not on file    Gets together: Not on file    Attends religious service: Not on file    Active member of club or organization: Not on file    Attends meetings of clubs or organizations: Not on file    Relationship status: Not on file  . Intimate partner violence:    Fear of current or ex partner: Not on file    Emotionally abused: Not on file    Physically abused: Not on file    Forced sexual activity: Not on file  Other Topics Concern  . 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 cyclobenzaprine (FLEXERIL) 5 MG tablet Take 1 tablet (5 mg total) by mouth 3 (three) times daily as needed for muscle spasms. Can cause drowsiness  . lisinopril (PRINIVIL,ZESTRIL) 10 MG tablet Take 1 tablet (10 mg total) by mouth daily.  . pantoprazole (PROTONIX) 20 MG tablet Take 1 tablet (20 mg total) by mouth daily. 30 minutes before food   No Known Allergies No results found for this or any previous visit (from the past 2160 hour(s)). Objective  Body mass index is 21.9 kg/m. Wt Readings from Last 3 Encounters:  06/04/18 139 lb 12.8 oz (63.4 kg)  05/21/18 135 lb 12.8 oz (61.6 kg)  02/03/18 141 lb 12.8 oz (64.3 kg)   Temp Readings from Last 3 Encounters:  06/04/18 98.3 F (36.8 C) (Oral)  05/21/18 99.1 F (37.3 C) (Oral)  02/03/18 98.4 F (36.9 C) (Oral)   BP Readings from Last 3 Encounters:  06/04/18 136/80  05/21/18 130/70  02/03/18 (!) 106/58   Pulse Readings from Last 3 Encounters:  06/04/18 64  05/21/18 66  02/03/18 65     Physical Exam  Constitutional: He is oriented to person, place, and time. Vital signs are normal. He appears well-developed and well-nourished. He is cooperative.  HENT:  Head: Normocephalic and atraumatic.  Mouth/Throat: Oropharynx is clear and moist and mucous membranes are normal.  Eyes: Pupils are equal, round, and reactive to light. Conjunctivae are normal.  Cardiovascular: Normal rate, regular rhythm and normal heart sounds.  Pulmonary/Chest: Effort normal and breath sounds normal.  Musculoskeletal:       Lumbar back: He exhibits no tenderness.  Neg straight leg test b/l   Neurological: He is alert and oriented to person, place, and time. Gait normal.  Skin: Skin is warm, dry and intact.  Psychiatric: He has a normal mood and affect. His speech is normal and behavior is normal. Judgment and thought content normal. Cognition and memory are normal.  Nursing note and vitals reviewed.   Assessment   1. HTN w/o meds today 2. GERD  3. Low back pain  4. Right wrist pain likely strain/tendonitis wearing brace  5. HM Plan  1. Cont meds  Check labs fasting at f/u  2. On protonix 20 mg am and take zantac 150 mg qhs  Consider inc to 40 mg qd  3. Xray  Refer to PT Prn Tylenol  Consider MRI if not better  Back brace  4. Bracing for now  5.  Had flu  Tdap had need to get records all vaccines and hep B titer  Records [facility] [place] and [place]. Health records reviewed  Ppd neg 12/10/17 Varicella + 10/27/17, chicken pox +titer 11/03/17  Flu had 07/16/17 Hep B 1 had 09/05/02, 06/13/09, 12/25/10 MMR had 10/27/17, 11/27/17 Tdap had 06/16/08  HIV neg 12/07/14   Of note derm appt 06/16/18  Provider: Dr. Olivia Mackie McLean-Scocuzza-Internal Medicine

## 2018-06-04 NOTE — Progress Notes (Signed)
Pre visit review using our clinic review tool, if applicable. No additional management support is needed unless otherwise documented below in the visit note. 

## 2018-08-06 ENCOUNTER — Encounter (INDEPENDENT_AMBULATORY_CARE_PROVIDER_SITE_OTHER): Payer: Self-pay

## 2018-08-06 ENCOUNTER — Encounter: Payer: Self-pay | Admitting: Internal Medicine

## 2018-08-06 ENCOUNTER — Ambulatory Visit: Payer: BC Managed Care – PPO | Admitting: Internal Medicine

## 2018-08-06 VITALS — BP 122/70 | HR 67 | Temp 98.0°F | Ht 67.0 in | Wt 149.4 lb

## 2018-08-06 DIAGNOSIS — M62838 Other muscle spasm: Secondary | ICD-10-CM

## 2018-08-06 DIAGNOSIS — M545 Low back pain, unspecified: Secondary | ICD-10-CM

## 2018-08-06 DIAGNOSIS — I10 Essential (primary) hypertension: Secondary | ICD-10-CM | POA: Diagnosis not present

## 2018-08-06 DIAGNOSIS — K219 Gastro-esophageal reflux disease without esophagitis: Secondary | ICD-10-CM | POA: Diagnosis not present

## 2018-08-06 MED ORDER — CYCLOBENZAPRINE HCL 5 MG PO TABS: 5.0000 mg | ORAL_TABLET | Freq: Every evening | ORAL | 5 refills | Status: AC | PRN

## 2018-08-06 MED ORDER — PANTOPRAZOLE SODIUM 40 MG PO TBEC: 40.0000 mg | DELAYED_RELEASE_TABLET | Freq: Every day | ORAL | 3 refills | Status: AC

## 2018-08-06 MED ORDER — MELOXICAM 7.5 MG PO TABS: 7.5000 mg | ORAL_TABLET | Freq: Every day | ORAL | 1 refills | Status: DC

## 2018-08-06 NOTE — Progress Notes (Signed)
Chief Complaint  Patient presents with  . Follow-up   C/o  1. GERD uncontrol on protonix 20 mg and ranitidine 150 wants to increase dose of ppi  2. C/o right low back pain 4-5/10 worse after bending over flexeril helps some and tried Tylenol no numbness or tingling or radiation down legs  3. HTN BP controlled on lis 10 mg qd    Review of Systems  Constitutional: Negative for weight loss.  HENT: Negative for hearing loss.   Eyes: Negative for blurred vision.  Respiratory: Negative for shortness of breath.   Cardiovascular: Negative for chest pain.  Gastrointestinal: Positive for heartburn.  Musculoskeletal: Positive for back pain.  Skin: Negative for rash.  Psychiatric/Behavioral: Negative for depression.   Past Medical History:  Diagnosis Date  . GERD (gastroesophageal reflux disease)   . Hypertension   . Low back pain    Past Surgical History:  Procedure Laterality Date  . HERNIA REPAIR     left inguinal    Family History  Problem Relation Age of Onset  . Hypertension Mother   . Hypertension Maternal Grandmother   . Dementia Maternal Grandfather   . Heart disease Maternal Grandfather        cad with stent  . Cancer Paternal Grandfather        colon   . Cancer Other        colon    Social History   Socioeconomic History  . Marital status: Married    Spouse name: Not on file  . Number of children: Not on file  . Years of education: Not on file  . Highest education level: Not on file  Occupational History  . Not on file  Social Needs  . Financial resource strain: Not on file  . Food insecurity:    Worry: Not on file    Inability: Not on file  . Transportation needs:    Medical: Not on file    Non-medical: Not on file  Tobacco Use  . Smoking status: Light Tobacco Smoker    Types: Cigarettes  . Smokeless tobacco: Never Used  . Tobacco comment: 1 cigarette qd since age 38 y.o smoking on and off   Substance and Sexual Activity  . Alcohol use: Yes  . Drug  use: Not Currently  . Sexual activity: Yes  Lifestyle  . Physical activity:    Days per week: Not on file    Minutes per session: Not on file  . Stress: Not on file  Relationships  . Social connections:    Talks on phone: Not on file    Gets together: Not on file    Attends religious service: Not on file    Active member of club or organization: Not on file    Attends meetings of clubs or organizations: Not on file    Relationship status: Not on file  . Intimate partner violence:    Fear of current or ex partner: Not on file    Emotionally abused: Not on file    Physically abused: Not on file    Forced sexual activity: Not on file  Other Topics Concern  . 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 cyclobenzaprine (FLEXERIL)  5 MG tablet Take 1 tablet (5 mg total) by mouth at bedtime as needed for muscle spasms. Can cause drowsiness  . lisinopril (PRINIVIL,ZESTRIL) 10 MG tablet Take 1 tablet (10 mg total) by mouth daily.  . pantoprazole (PROTONIX) 20 MG tablet Take 1 tablet (20 mg total) by mouth daily. 30 minutes before food  . ranitidine (ZANTAC) 150 MG tablet Take 150 mg by mouth at bedtime.   No Known Allergies No results found for this or any previous visit (from the past 2160 hour(s)). Objective  Body mass index is 23.4 kg/m. Wt Readings from Last 3 Encounters:  08/06/18 149 lb 6.4 oz (67.8 kg)  06/04/18 139 lb 12.8 oz (63.4 kg)  05/21/18 135 lb 12.8 oz (61.6 kg)   Temp Readings from Last 3 Encounters:  08/06/18 98 F (36.7 C) (Oral)  06/04/18 98.3 F (36.8 C) (Oral)  05/21/18 99.1 F (37.3 C) (Oral)   BP Readings from Last 3 Encounters:  08/06/18 122/70  06/04/18 136/80  05/21/18 130/70   Pulse Readings from Last 3 Encounters:  08/06/18 67   06/04/18 64  05/21/18 66    Physical Exam  Constitutional: He is oriented to person, place, and time. Vital signs are normal. He appears well-developed and well-nourished. He is cooperative.  HENT:  Head: Normocephalic and atraumatic.  Mouth/Throat: Oropharynx is clear and moist and mucous membranes are normal.  Eyes: Pupils are equal, round, and reactive to light. Conjunctivae are normal.  Cardiovascular: Normal rate, regular rhythm and normal heart sounds.  Pulmonary/Chest: Effort normal and breath sounds normal.  Musculoskeletal:       Lumbar back: He exhibits no tenderness and no bony tenderness.  Neurological: He is alert and oriented to person, place, and time. Gait normal.  Skin: Skin is warm, dry and intact.  Psychiatric: He has a normal mood and affect. His speech is normal and behavior is normal. Judgment and thought content normal. Cognition and memory are normal.  Nursing note and vitals reviewed.   Assessment   1. HTN  2. GERD 3. Low back pain  4. HM Plan   1. Cont lis 10 mg qd  2. Increase protonix to 40 mg qd  3. Order MRI, rec PT  Flexeril add mobic prn 4.  Had flu 2019 at work  Tdap had need to get records all vaccines and hep B titer  -check record or Tdap  Records [facility] in [place] and [place]. Health records reviewed  Ppd neg 12/10/17 Varicella + 10/27/17, chicken pox +titer 11/03/17  Flu had 07/16/17 Hep B 1 had 09/05/02, 06/13/09, 12/25/10 MMR had 10/27/17, 11/27/17 Tdap had 06/16/08  HIV neg 12/07/14  Of note derm appt 06/16/18 saw and chin cyst removed  Provider: Dr. Olivia Mackie McLean-Scocuzza-Internal Medicine

## 2018-08-06 NOTE — Progress Notes (Signed)
Pre visit review using our clinic review tool, if applicable. No additional management support is needed unless otherwise documented below in the visit note. 

## 2018-08-06 NOTE — Patient Instructions (Addendum)
[facility] or Web MD video for back stretches  Stop Ranitidine  Pepcid AC at night   Check on Tdap vaccine date every 10 days schedule nurse visit for this if not had    Food Choices for Gastroesophageal Reflux Disease, Adult When you have gastroesophageal reflux disease (GERD), the foods you eat and your eating habits are very important. Choosing the right foods can help ease the discomfort of GERD. Consider working with a diet and nutrition specialist (dietitian) to help you make healthy food choices. What general guidelines should I follow? Eating plan  Choose healthy foods low in fat, such as fruits, vegetables, whole grains, low-fat dairy products, and lean meat, fish, and poultry.  Eat frequent, small meals instead of three large meals each day. Eat your meals slowly, in a relaxed setting. Avoid bending over or lying down until 2-3 hours after eating.  Limit high-fat foods such as fatty meats or fried foods.  Limit your intake of oils, butter, and shortening to less than 8 teaspoons each day.  Avoid the following: ? Foods that cause symptoms. These may be different for different people. Keep a food diary to keep track of foods that cause symptoms. ? Alcohol. ? Drinking large amounts of liquid with meals. ? Eating meals during the 2-3 hours before bed.  Cook foods using methods other than frying. This may include baking, grilling, or broiling. Lifestyle   Maintain a healthy weight. Ask your health care provider what weight is healthy for you. If you need to lose weight, work with your health care provider to do so safely.  Exercise for at least 30 minutes on 5 or more days each week, or as told by your health care provider.  Avoid wearing clothes that fit tightly around your waist and chest.  Do not use any products that contain nicotine or tobacco, such as cigarettes and e-cigarettes. If you need help quitting, ask your health care provider.  Sleep with the head of your  bed raised. Use a wedge under the mattress or blocks under the bed frame to raise the head of the bed. What foods are not recommended? The items listed may not be a complete list. Talk with your dietitian about what dietary choices are best for you. Grains Pastries or quick breads with added fat. [place] toast. Vegetables Deep fried vegetables. [place] fries. Any vegetables prepared with added fat. Any vegetables that cause symptoms. For some people this may include tomatoes and tomato products, chili peppers, onions and garlic, and horseradish. Fruits Any fruits prepared with added fat. Any fruits that cause symptoms. For some people this may include citrus fruits, such as oranges, grapefruit, pineapple, and lemons. Meats and other protein foods High-fat meats, such as fatty beef or pork, hot dogs, ribs, ham, sausage, salami and bacon. Fried meat or protein, including fried fish and fried chicken. Nuts and nut butters. Dairy Whole milk and chocolate milk. Sour cream. Cream. Ice cream. Cream cheese. Milk shakes. Beverages Coffee and tea, with or without caffeine. Carbonated beverages. Sodas. Energy drinks. Fruit juice made with acidic fruits (such as orange or grapefruit). Tomato juice. Alcoholic drinks. Fats and oils Butter. Margarine. Shortening. Ghee. Sweets and desserts Chocolate and cocoa. Donuts. Seasoning and other foods Pepper. Peppermint and spearmint. Any condiments, herbs, or seasonings that cause symptoms. For some people, this may include curry, hot sauce, or vinegar-based salad dressings. Summary  When you have gastroesophageal reflux disease (GERD), food and lifestyle choices are very important to help ease  the discomfort of GERD.  Eat frequent, small meals instead of three large meals each day. Eat your meals slowly, in a relaxed setting. Avoid bending over or lying down until 2-3 hours after eating.  Limit high-fat foods such as fatty meat or fried foods. This information  is not intended to replace advice given to you by your health care provider. Make sure you discuss any questions you have with your health care provider. Document Released: 09/22/2005 Document Revised: 09/23/2016 Document Reviewed: 09/23/2016 Elsevier Interactive Patient Education  [employer].

## 2018-08-26 ENCOUNTER — Ambulatory Visit: Admission: RE | Admit: 2018-08-26 | Payer: BC Managed Care – PPO | Source: Ambulatory Visit

## 2018-11-03 ENCOUNTER — Ambulatory Visit: Payer: Self-pay

## 2018-11-03 NOTE — Telephone Encounter (Signed)
Patient says he does repetitive work but that he uses both arms doing his work . Its is just the left arm that goes intermittently from tingling to numb feeling and aching. Last several minutes sometimes longer . Last night used heating pad and this seem to help for a little while. But back today and worse than yesterday.

## 2018-11-03 NOTE — Telephone Encounter (Signed)
Returned call to patient who was looking for advice for his symptoms of numbness in his left arm.  He has already been scheduled an appointment for tomorrow. 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 He denies injury. He just had episodes that come and go of tingling numbness and vibration sensation to his left arm. No chest pain. No other neurological symptoms. Per protocol pt appointment is appropriate. Care advice read to patient. Pt encouraged to keep tomorrows appointment . Pt verbalized understanding of all instructions.  Reason for Disposition . [1] Numbness or tingling in one or both hands AND [2] is a chronic symptom (recurrent or ongoing AND present > 4 weeks)  Answer Assessment - Initial Assessment Questions 1. SYMPTOM: "What is the main symptom you are concerned about?" (e.g., weakness, numbness)     Numbness left arm 2. ONSET: "When did this start?" (minutes, hours, days; while sleeping)     Off and on 2 days 3. LAST NORMAL: "When was the last time you were normal (no symptoms)?"     Normal now 4. PATTERN "Does this come and go, or has it been constant since it started?"  "Is it present now?"     Come and goes 5. CARDIAC SYMPTOMS: "Have you had any of the following symptoms: chest pain, difficulty breathing, palpitations?"     no 6. NEUROLOGIC SYMPTOMS: "Have you had any of the following symptoms: headache, dizziness, vision loss, double vision, changes in speech, unsteady on your feet?"     no 7. OTHER SYMPTOMS: "Do you have any other symptoms?"     Legs are sore 8. PREGNANCY: "Is there any chance you are pregnant?" "When was your last menstrual period?"     N/A  Protocols used: NEUROLOGIC DEFICIT-A-AH

## 2018-11-03 NOTE — Telephone Encounter (Signed)
If sx's worse go to ED before tomorrow  I think could be carpal tunnel or something coming from neck  We could consider neurology referral tomorrow for NCS/EMG  TMS

## 2018-11-03 NOTE — Telephone Encounter (Signed)
Patient notified and voiced understanding.

## 2018-11-04 ENCOUNTER — Encounter: Payer: Self-pay | Admitting: Family Medicine

## 2018-11-04 ENCOUNTER — Ambulatory Visit: Payer: BC Managed Care – PPO | Admitting: Family Medicine

## 2018-11-04 ENCOUNTER — Ambulatory Visit (INDEPENDENT_AMBULATORY_CARE_PROVIDER_SITE_OTHER): Payer: BC Managed Care – PPO

## 2018-11-04 VITALS — BP 132/88 | HR 69 | Temp 98.9°F | Resp 16 | Ht 67.0 in | Wt 141.6 lb

## 2018-11-04 DIAGNOSIS — R202 Paresthesia of skin: Secondary | ICD-10-CM

## 2018-11-04 MED ORDER — MELOXICAM 7.5 MG PO TABS: 7.5000 mg | ORAL_TABLET | Freq: Every day | ORAL | 1 refills | Status: AC

## 2018-11-04 NOTE — Progress Notes (Signed)
Subjective:    Patient ID: Tyler Norton, male    DOB: 22-Jan-1976, 43 y.o.   MRN: [id number]  HPI   Patient presents to clinic complaining of some numbness and tingling in left arm.  Patient states he really started noticed it about 3 days ago.  Did take a dose of ibuprofen and it did help reduce numbness tingling and pain somewhat.  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  Does do a lot of repetitive motions with his job.  Denies any neck pain, injury. Denies any injury to arm or shoulder.   Patient Active Problem List   Diagnosis Date Noted  . Low back pain 06/04/2018  . Right wrist pain 06/04/2018  . Essential hypertension 02/03/2018  . Right lower quadrant abdominal pain 02/03/2018  . Gastroesophageal reflux disease 02/03/2018  . Abscess 02/03/2018  . Pain of right midfoot 02/03/2018   Social History   Tobacco Use  . Smoking status: Light Tobacco Smoker    Types: Cigarettes  . Smokeless tobacco: Never Used  . Tobacco comment: 1 cigarette qd since age 43 y.o smoking on and off   Substance Use Topics  . Alcohol use: Yes   Review of Systems  Constitutional: Negative for chills, fatigue and fever.  HENT: Negative for congestion, ear pain, sinus pain and sore throat.   Eyes: Negative.   Respiratory: Negative for cough, shortness of breath and wheezing.   Cardiovascular: Negative for chest pain, palpitations and leg swelling.  Gastrointestinal: Negative for abdominal pain, diarrhea, nausea and vomiting.  Genitourinary: Negative for dysuria, frequency and urgency.  Musculoskeletal: Negative for arthralgias and myalgias.  Skin: Negative for color change, pallor and rash.  Neurological: Negative for syncope, light-headedness and headaches. +tingling/numbness off and on on LUE Psychiatric/Behavioral: The patient is not nervous/anxious.       Objective:   Physical Exam Vitals signs and nursing note  reviewed.  Constitutional:      General: He is not in acute distress.    Appearance: He is not toxic-appearing or diaphoretic.  HENT:     Head: Normocephalic and atraumatic.     Mouth/Throat:     Mouth: Mucous membranes are moist.  Eyes:     General: No scleral icterus.       Right eye: No discharge.        Left eye: No discharge.     Extraocular Movements: Extraocular movements intact.     Pupils: Pupils are equal, round, and reactive to light.  Neck:     Musculoskeletal: Normal range of motion and neck supple. No neck rigidity or muscular tenderness.  Cardiovascular:     Rate and Rhythm: Normal rate and regular rhythm.     Pulses: Normal pulses.  Pulmonary:     Effort: Pulmonary effort is normal. No respiratory distress.     Breath sounds: Normal breath sounds. No wheezing, rhonchi or rales.  Musculoskeletal:     Right lower leg: No edema.     Left lower leg: No edema.  Lymphadenopathy:     Cervical: No cervical adenopathy.  Skin:    General: Skin is warm and dry.     Coloration: Skin is not jaundiced or pale.  Neurological:     Mental Status: He is alert and oriented to person, place, and time.     Comments: Grips equal and strong. UE strength normal. Negative phalens test, positive tinels sign  Psychiatric:  Mood and Affect: Mood normal.        Behavior: Behavior normal.    Vitals:   11/04/18 1536  BP: 132/88  Pulse: 69  Resp: 16  Temp: 98.9 F (37.2 C)  SpO2: 96%      Assessment & Plan:   Tingling in left upper extremity - suspect it is related to some sort of nerve irritation/inflammation.  We will get x-ray of neck to look vertebrae and cervical spine.  I will also place referral to neurology for EMG studies set up for nerve conduction testing.  Patient will use Mobic to reduce inflammation/pain in hopes of improving tingling sensation.  Patient aware he will be contacted in regards to results of neck x-ray and setting up EMG appointment.  Advised to  call office with any changes in symptoms.  He will keep appointment as planned in March at this time.

## 2018-12-15 ENCOUNTER — Ambulatory Visit: Payer: BC Managed Care – PPO | Admitting: Internal Medicine

## 2018-12-28 ENCOUNTER — Ambulatory Visit: Payer: BC Managed Care – PPO | Admitting: Internal Medicine

## 2019-01-18 ENCOUNTER — Other Ambulatory Visit: Payer: Self-pay | Admitting: Internal Medicine

## 2019-01-18 DIAGNOSIS — I10 Essential (primary) hypertension: Secondary | ICD-10-CM

## 2019-01-18 MED ORDER — LISINOPRIL 10 MG PO TABS: 10.0000 mg | ORAL_TABLET | Freq: Every day | ORAL | 3 refills | Status: AC

## 2019-02-11 ENCOUNTER — Telehealth: Payer: Self-pay | Admitting: *Deleted

## 2019-02-11 ENCOUNTER — Other Ambulatory Visit: Payer: Self-pay

## 2019-02-11 ENCOUNTER — Telehealth: Payer: Self-pay | Admitting: Radiology

## 2019-02-11 ENCOUNTER — Other Ambulatory Visit (INDEPENDENT_AMBULATORY_CARE_PROVIDER_SITE_OTHER): Payer: Self-pay

## 2019-02-11 ENCOUNTER — Ambulatory Visit: Payer: Self-pay | Admitting: Internal Medicine

## 2019-02-11 ENCOUNTER — Ambulatory Visit (INDEPENDENT_AMBULATORY_CARE_PROVIDER_SITE_OTHER): Payer: Self-pay | Admitting: Internal Medicine

## 2019-02-11 ENCOUNTER — Other Ambulatory Visit (HOSPITAL_COMMUNITY)
Admission: RE | Admit: 2019-02-11 | Discharge: 2019-02-11 | Disposition: A | Discharge status: Home / Self Care | Payer: Self-pay | Source: Ambulatory Visit | Attending: Internal Medicine | Admitting: Internal Medicine

## 2019-02-11 ENCOUNTER — Other Ambulatory Visit: Payer: Self-pay | Admitting: Internal Medicine

## 2019-02-11 DIAGNOSIS — Z113 Encounter for screening for infections with a predominantly sexual mode of transmission: Secondary | ICD-10-CM

## 2019-02-11 DIAGNOSIS — E559 Vitamin D deficiency, unspecified: Secondary | ICD-10-CM

## 2019-02-11 DIAGNOSIS — Z1159 Encounter for screening for other viral diseases: Secondary | ICD-10-CM

## 2019-02-11 DIAGNOSIS — B356 Tinea cruris: Secondary | ICD-10-CM

## 2019-02-11 DIAGNOSIS — Z1329 Encounter for screening for other suspected endocrine disorder: Secondary | ICD-10-CM

## 2019-02-11 DIAGNOSIS — R300 Dysuria: Secondary | ICD-10-CM | POA: Insufficient documentation

## 2019-02-11 DIAGNOSIS — I10 Essential (primary) hypertension: Secondary | ICD-10-CM

## 2019-02-11 DIAGNOSIS — L309 Dermatitis, unspecified: Secondary | ICD-10-CM

## 2019-02-11 LAB — CBC WITH DIFFERENTIAL/PLATELET
Basophils Absolute: 0.1 10*3/uL (ref 0.0–0.1)
Basophils Relative: 1 % (ref 0.0–3.0)
Eosinophils Absolute: 0.3 10*3/uL (ref 0.0–0.7)
Eosinophils Relative: 6.7 % — ABNORMAL HIGH (ref 0.0–5.0)
HCT: 38.3 % — ABNORMAL LOW (ref 39.0–52.0)
Hemoglobin: 13 g/dL (ref 13.0–17.0)
Lymphocytes Relative: 46 % (ref 12.0–46.0)
Lymphs Abs: 2.4 10*3/uL (ref 0.7–4.0)
MCHC: 33.8 g/dL (ref 30.0–36.0)
MCV: 91.9 fl (ref 78.0–100.0)
Monocytes Absolute: 0.5 10*3/uL (ref 0.1–1.0)
Monocytes Relative: 9.2 % (ref 3.0–12.0)
Neutro Abs: 1.9 10*3/uL (ref 1.4–7.7)
Neutrophils Relative %: 37.1 % — ABNORMAL LOW (ref 43.0–77.0)
Platelets: 246 10*3/uL (ref 150.0–400.0)
RBC: 4.17 Mil/uL — ABNORMAL LOW (ref 4.22–5.81)
RDW: 14.5 % (ref 11.5–15.5)
WBC: 5.2 10*3/uL (ref 4.0–10.5)

## 2019-02-11 LAB — TSH: TSH: 0.29 u[IU]/mL — ABNORMAL LOW (ref 0.35–4.50)

## 2019-02-11 LAB — COMPREHENSIVE METABOLIC PANEL
ALT: 36 U/L (ref 0–53)
AST: 27 U/L (ref 0–37)
Albumin: 4.5 g/dL (ref 3.5–5.2)
Alkaline Phosphatase: 75 U/L (ref 39–117)
BUN: 19 mg/dL (ref 6–23)
CO2: 30 mEq/L (ref 19–32)
Calcium: 9.1 mg/dL (ref 8.4–10.5)
Chloride: 100 mEq/L (ref 96–112)
Creatinine, Ser: 1.06 mg/dL (ref 0.40–1.50)
GFR: 92.58 mL/min (ref >60.00)
Glucose, Bld: 127 mg/dL — ABNORMAL HIGH (ref 70–99)
Potassium: 4.3 mEq/L (ref 3.5–5.1)
Sodium: 136 mEq/L (ref 135–145)
Total Bilirubin: 0.3 mg/dL (ref 0.2–1.2)
Total Protein: 7.2 g/dL (ref 6.0–8.3)

## 2019-02-11 LAB — T4, FREE: Free T4: 0.85 ng/dL (ref 0.60–1.60)

## 2019-02-11 LAB — VITAMIN D 25 HYDROXY (VIT D DEFICIENCY, FRACTURES): VITD: 20.47 ng/mL — ABNORMAL LOW (ref 30.00–100.00)

## 2019-02-11 MED ORDER — MICONAZOLE NITRATE 2 % EX CREA: 1.0000 "application " | TOPICAL_CREAM | Freq: Two times a day (BID) | CUTANEOUS | 0 refills | Status: AC

## 2019-02-11 MED ORDER — HYDROCORTISONE 2.5 % EX OINT: TOPICAL_OINTMENT | Freq: Two times a day (BID) | CUTANEOUS | 0 refills | Status: AC

## 2019-02-11 MED ORDER — FLUCONAZOLE 150 MG PO TABS: 150.0000 mg | ORAL_TABLET | Freq: Once | ORAL | 0 refills | Status: AC

## 2019-02-11 NOTE — Telephone Encounter (Signed)
Pt came in for blood work and urine. Pt unable to obtain two sets of urine samples for dirty and clean urine. Pt took urine cup home and stated he will bring specimen back for urinalysis w/ micro and urine culture.

## 2019-02-11 NOTE — Progress Notes (Addendum)
Failed virtual changed to telephone Note  I connected with Tyler Norton   on 02/11/19 at 11:35 AM EDT by virtual initially changed to telephone and verified that I am speaking with the correct person using two identifiers.  Location patient: home Location provider:work  Persons participating in the virtual visit: patient, provider  I discussed the limitations of evaluation and management by telemedicine and the availability of in person appointments. The patient expressed understanding and agreed to proceed.   HPI: 1. Wants std testing had affair on wife 12/2018 and condom rippe d. Denies dysuria  2. Having irritation in pubic region around testicles and pubic hairs with itching denies cyst/abscess he used dial mens soap which he also thinks may have irritated his skin and will switch back do Dove has Gain laundry detergent which isnt new   He has been unemployed x 2 months and was furloughed from his job [employer] so on unemployment   ROS: See pertinent positives and negatives per HPI.  Past Medical History:  Diagnosis Date  . GERD (gastroesophageal reflux disease)   . Hypertension   . Low back pain     Past Surgical History:  Procedure Laterality Date  . HERNIA REPAIR     left inguinal     Family History  Problem Relation Age of Onset  . Hypertension Mother   . Hypertension Maternal Grandmother   . Dementia Maternal Grandfather   . Heart disease Maternal Grandfather        cad with stent  . Cancer Paternal Grandfather        colon   . Cancer Other        colon     SOCIAL HX: married with kids   Current Outpatient Medications:  .  cyclobenzaprine (FLEXERIL) 5 MG tablet, Take 1 tablet (5 mg total) by mouth at bedtime as needed for muscle spasms. Can cause drowsiness, Disp: 30 tablet, Rfl: 5 .  fluconazole (DIFLUCAN) 150 MG tablet, Take 1 tablet (150 mg total) by mouth once for 1 dose., Disp: 1 tablet, Rfl: 0 .  hydrocortisone 2.5 % ointment, Apply topically 2 (two) times  daily., Disp: 30 g, Rfl: 0 .  lisinopril (PRINIVIL,ZESTRIL) 10 MG tablet, Take 1 tablet (10 mg total) by mouth daily., Disp: 90 tablet, Rfl: 3 .  meloxicam (MOBIC) 7.5 MG tablet, Take 1-2 tablets (7.5-15 mg total) by mouth daily., Disp: 60 tablet, Rfl: 1 .  miconazole (MICATIN) 2 % cream, Apply 1 application topically 2 (two) times daily. Private area irritation, Disp: 60 g, Rfl: 0 .  pantoprazole (PROTONIX) 40 MG tablet, Take 1 tablet (40 mg total) by mouth daily. 30 minutes before food, Disp: 90 tablet, Rfl: 3  EXAM:  VITALS per patient if applicable:  GENERAL: alert, oriented, appears well and in no acute distress  HEENT: atraumatic, conjunttiva clear, no obvious abnormalities on inspection of external nose and ears  NECK: normal movements of the head and neck  LUNGS: on inspection no signs of respiratory distress, breathing rate appears normal, no obvious gross SOB, gasping or wheezing  CV: no obvious cyanosis  MS: moves all visible extremities without noticeable abnormality  PSYCH/NEURO: pleasant and cooperative, no obvious depression or anxiety, speech and thought processing grossly intact  ASSESSMENT AND PLAN:  Discussed the following assessment and plan:  Dermatitis - Plan: hydrocortisone 2.5 % ointment  Dysuria - Plan: Urine cytology ancillary only([place]), Urine Culture, Urinalysis, Routine w reflex microscopic, CANCELED: Urinalysis, Routine w reflex microscopic, CANCELED: Urine Culture  Essential hypertension - Plan: Comprehensive metabolic panel, CBC w/Diff  Thyroid disorder screening - Plan: TSH, T4, free  Vitamin D deficiency - Plan: Vitamin D (25 hydroxy)  Venereal disease screening - Plan: HIV antibody (with reflex), RPR, Hepatitis B surface antibody,quantitative, Hepatitis C antibody, Hepatitis B surface antigen, HSV(herpes smplx)abs-1+2(IgG+IgM)-bld, CANCELED: HSV(herpes smplx)abs-1+2(IgG+IgM)-bld  Screening for viral disease - Plan:  Measles/Mumps/Rubella Immunity  Jock itch - Plan: miconazole (MICATIN) 2 % cream, hydrocortisone 2.5 % ointment, fluconazole (DIFLUCAN) 150 MG tablet   HM Had flu 2019 at work  Tdap had need to get records all vaccines and hep B titer  -check record or Tdap  Records [facility] in [place] and [place]. Health records reviewed  Ppd neg 12/10/17 Varicella + 10/27/17, chicken pox +titer 11/03/17  Flu had 07/16/17 Hep B 1 had 09/05/02, 06/13/09, 12/25/10 MMR had 10/27/17, 11/27/17-check titer today   Tdap had 06/16/08  HIV neg 12/07/14  I discussed the assessment and treatment plan with the patient. The patient was provided an opportunity to ask questions and all were answered. The patient agreed with the plan and demonstrated an understanding of the instructions.   The patient was advised to call back or seek an in-person evaluation if the symptoms worsen or if the condition fails to improve as anticipated.  Time spent 15 minutes Delorise Jackson, MD

## 2019-02-11 NOTE — Telephone Encounter (Signed)
Pt has lab appt this morning. Please place future lab orders.

## 2019-02-14 ENCOUNTER — Telehealth: Payer: Self-pay | Admitting: *Deleted

## 2019-02-14 ENCOUNTER — Other Ambulatory Visit: Payer: Self-pay | Admitting: Internal Medicine

## 2019-02-14 ENCOUNTER — Encounter: Payer: Self-pay | Admitting: Internal Medicine

## 2019-02-14 DIAGNOSIS — R946 Abnormal results of thyroid function studies: Secondary | ICD-10-CM

## 2019-02-14 DIAGNOSIS — E559 Vitamin D deficiency, unspecified: Secondary | ICD-10-CM | POA: Insufficient documentation

## 2019-02-14 LAB — MEASLES/MUMPS/RUBELLA IMMUNITY
Mumps IgG: >300 AU/mL
Rubella: 9.65 index
Rubeola IgG: >300 AU/mL

## 2019-02-14 LAB — HEPATITIS B SURFACE ANTIBODY, QUANTITATIVE: Hepatitis B-Post: >1000 m[IU]/mL (ref >=10)

## 2019-02-14 LAB — T3, FREE: T3, Free: 2.9 pg/mL (ref 2.3–4.2)

## 2019-02-14 LAB — HEPATITIS B SURFACE ANTIGEN: Hepatitis B Surface Ag: NONREACTIVE

## 2019-02-14 LAB — RPR: RPR Ser Ql: NONREACTIVE

## 2019-02-14 LAB — URINE CYTOLOGY ANCILLARY ONLY
Chlamydia: NEGATIVE
Neisseria Gonorrhea: NEGATIVE
Trichomonas: NEGATIVE

## 2019-02-14 LAB — TEST AUTHORIZATION

## 2019-02-14 LAB — HEPATITIS C ANTIBODY
Hepatitis C Ab: NONREACTIVE
SIGNAL TO CUT-OFF: 0.01 (ref <1.00)

## 2019-02-14 LAB — HIV ANTIBODY (ROUTINE TESTING W REFLEX): HIV 1&2 Ab, 4th Generation: NONREACTIVE

## 2019-02-14 NOTE — Telephone Encounter (Signed)
Add on test reordered b/c it can not be added to the tube sent to [facility] last week.

## 2019-02-15 ENCOUNTER — Ambulatory Visit: Payer: Self-pay

## 2019-02-15 NOTE — Telephone Encounter (Signed)
Pt called to request information. Note of Dr Mclean-Scocuzza 02/15/2019. States start vit D3 5000 IU daily over the counter was read to patient. Pt verbalized understanding.  Reason for Disposition . General information question, no triage required and triager able to answer question  Answer Assessment - Initial Assessment Questions 1. REASON FOR CALL or QUESTION: "What is your reason for calling today?" or "How can I best help you?" or "What question do you have that I can help answer?"    " What dose of Vit D am I to  Take"  Protocols used: INFORMATION ONLY CALL-A-AH

## 2019-02-16 LAB — URINE CYTOLOGY ANCILLARY ONLY: Bacterial vaginitis: NEGATIVE

## 2019-02-16 LAB — HSV(HERPES SMPLX)ABS-I+II(IGG+IGM)-BLD
HSV 1 Glycoprotein G Ab, IgG: 32.4 index — ABNORMAL HIGH (ref 0.00–0.90)
HSV 2 IgG, Type Spec: <0.91 index (ref 0.00–0.90)
HSVI/II Comb IgM: 1.05 Ratio — ABNORMAL HIGH (ref 0.00–0.90)

## 2019-02-16 NOTE — Addendum Note (Signed)
Addended by: Quentin Ore on: 02/16/2019 11:32 AM   Modules accepted: Level of Service

## 2019-08-19 ENCOUNTER — Ambulatory Visit: Payer: Self-pay | Admitting: Internal Medicine

## 2020-04-08 IMAGING — DX DG CERVICAL SPINE COMPLETE 4+V
5 series · 5 of 5 positions shown · non-contrast
Comparison: None.

CLINICAL DATA: 43-year-old male with a history neck pain

EXAM:
CERVICAL SPINE - COMPLETE 4+ VIEW

[cervical spine ap]
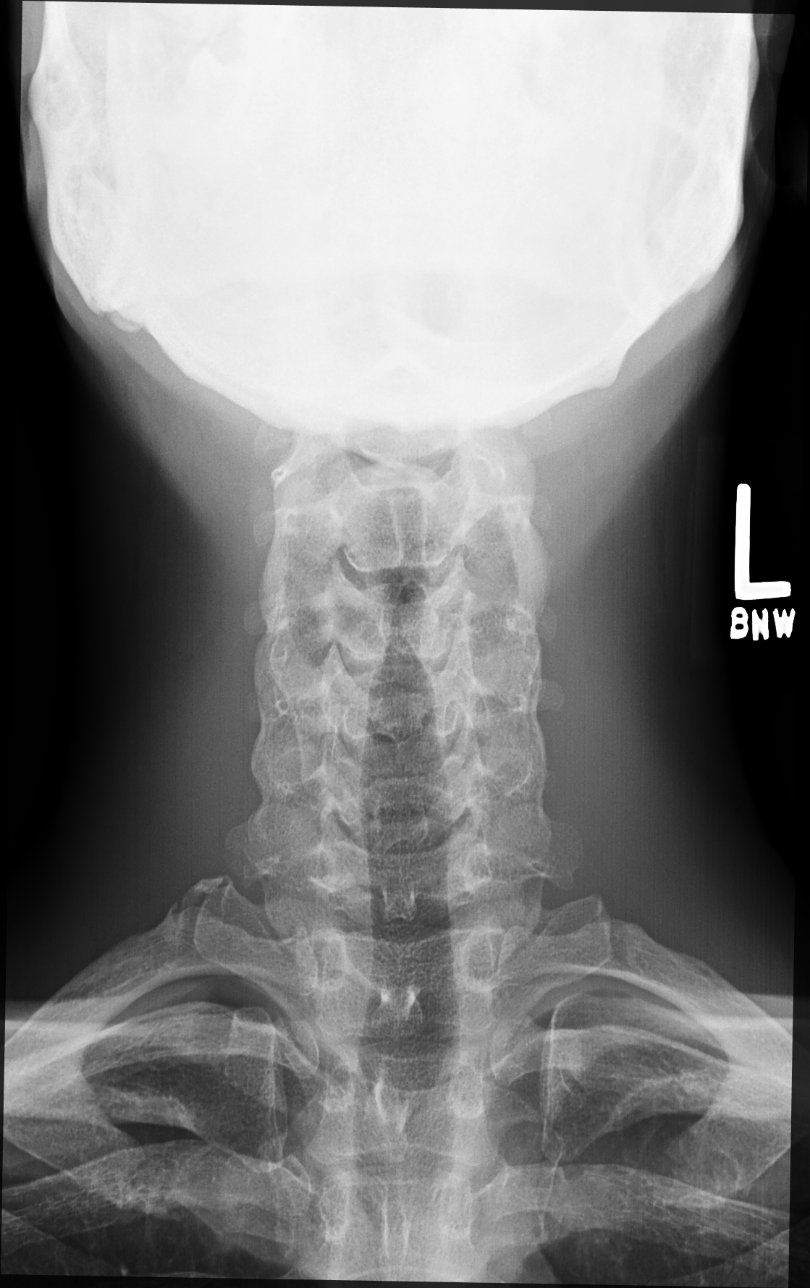

[cervical spine oblique (1 of 2)]
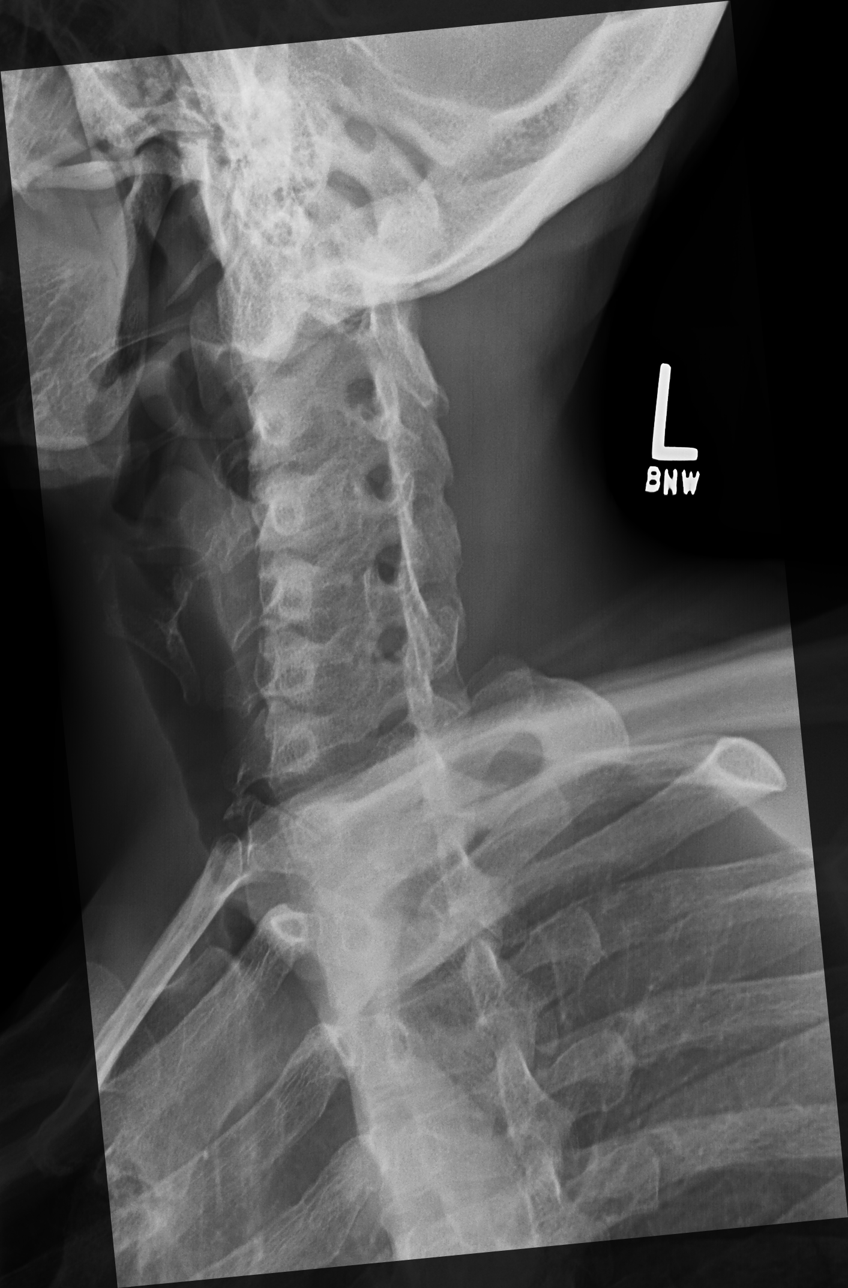

[cervical spine oblique (2 of 2)]
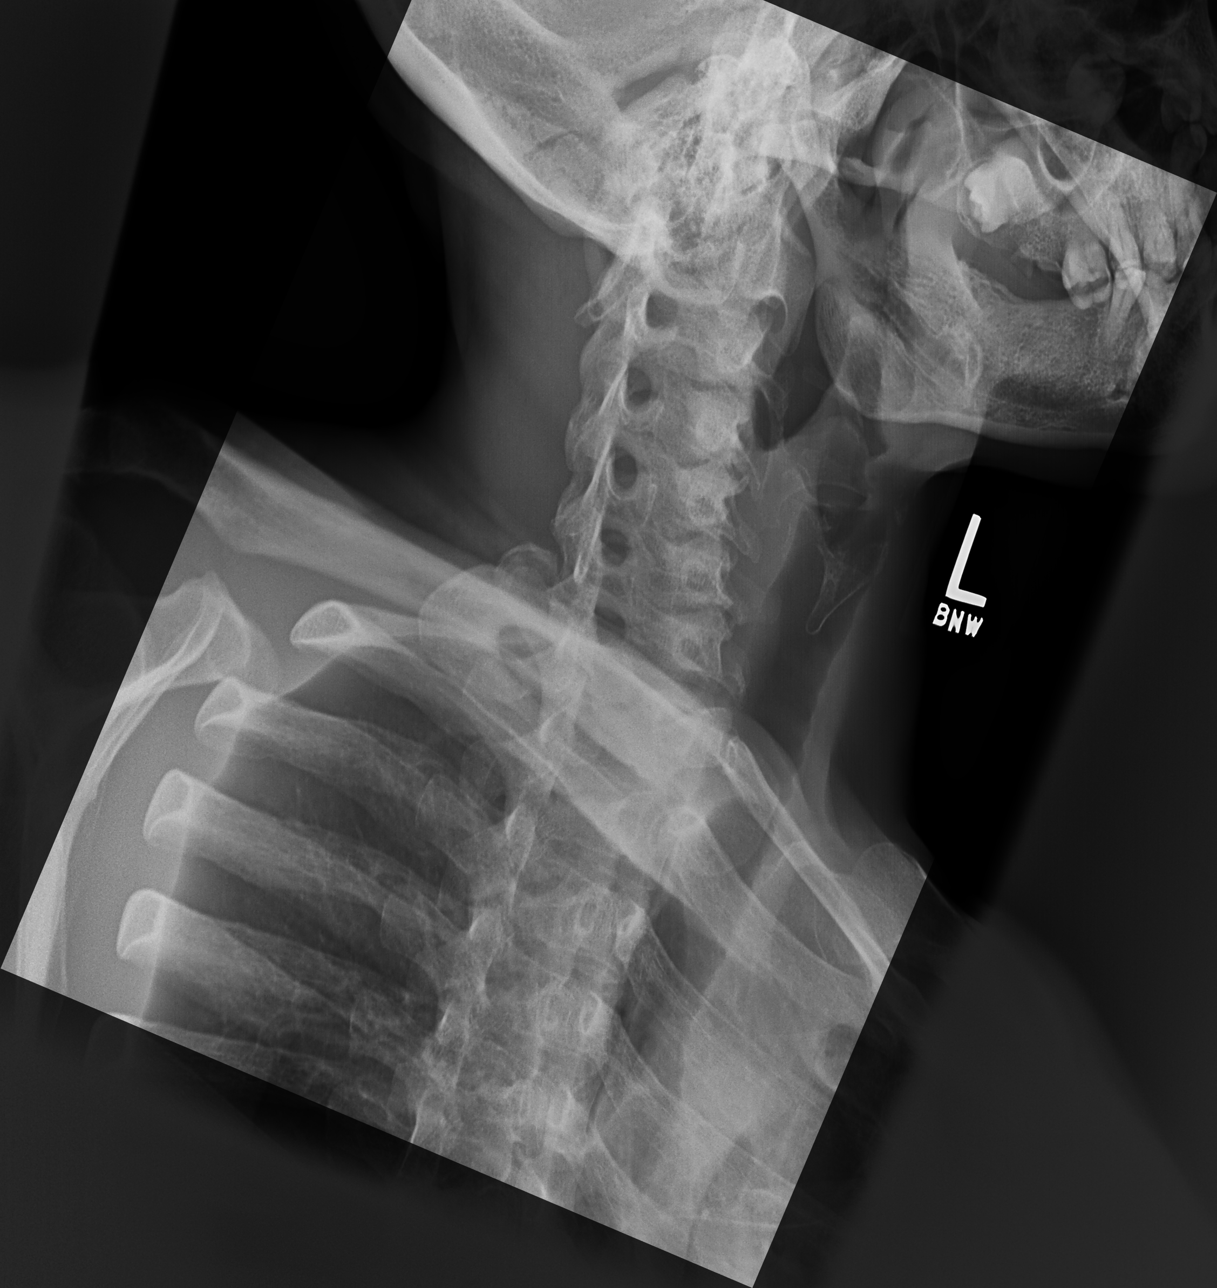

[cervical spine lat]
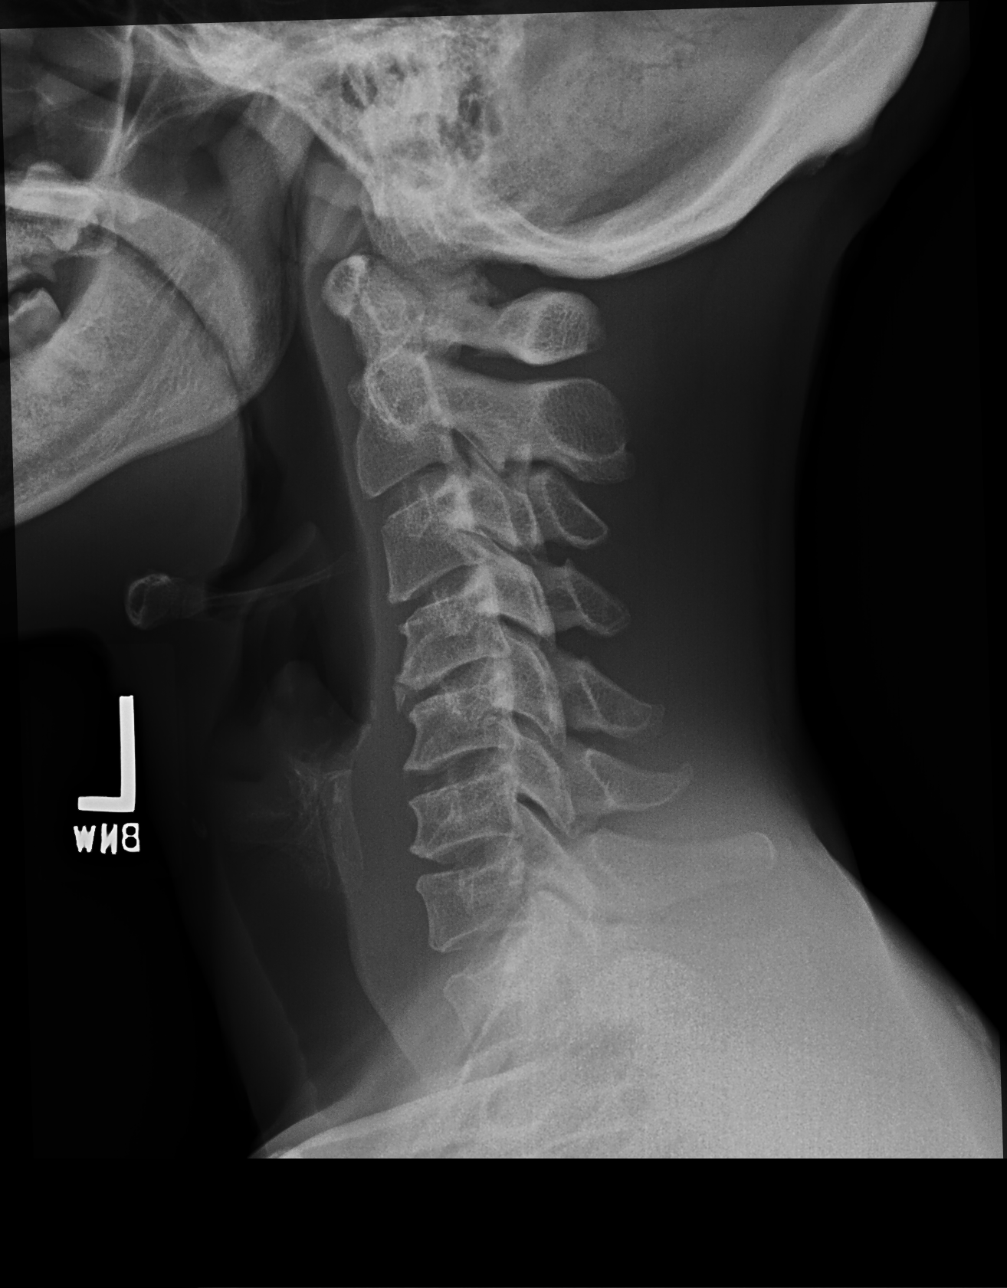

[cervical spine open mouth ap]
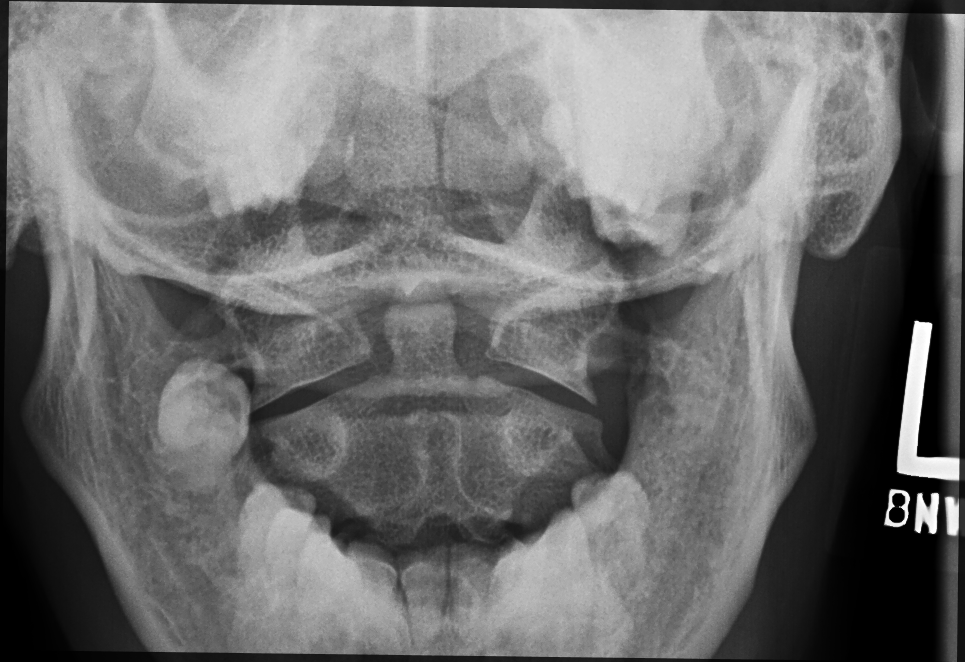

[5 of 5 positions shown; findings below may reference images not displayed]

FINDINGS: Cervical Spine:

Cervical elements maintain relative anatomic alignment from the
level of C1-T1. Unremarkable appearance of the craniocervical
junction. Reversal of the normal cervical lordosis, potentially
positional

No subluxation, anterolisthesis, retrolisthesis.

No acute fracture line identified.

Vertebral body heights maintained.

Mild disc space narrowing of the cervical spine, with endplate
changes and chronic changes most pronounced at C4-C5.

No significant facet disease. Oblique images demonstrates no
foraminal encroachment on the right. On the left there is
questionable early foraminal encroachment at the C2-C3 level
secondary to uncovertebral joint disease.

Prevertebral soft tissues within normal limits.

Open mouth odontoid view unremarkable
IMPRESSION: Negative for acute fracture or malalignment of the cervical spine.

Early degenerative changes of the cervical spine.
